# Patient Record
Sex: Female | Born: 1975 | Race: Black or African American | Hispanic: No | Marital: Single | Smoking: Current every day smoker
Health system: Southern US, Community
[De-identification: ages and names within clinical notes are randomized; demographics above are authoritative.]

## PROBLEM LIST (undated history)

## (undated) HISTORY — PX: HAND SURGERY: SHX662

---

## 1998-03-06 ENCOUNTER — Other Ambulatory Visit: Admission: RE | Admit: 1998-03-06 | Discharge: 1998-03-06 | Payer: Self-pay | Admitting: Obstetrics

## 1998-08-02 ENCOUNTER — Other Ambulatory Visit: Admission: RE | Admit: 1998-08-02 | Discharge: 1998-08-02 | Payer: Self-pay | Admitting: Obstetrics

## 1998-11-07 ENCOUNTER — Other Ambulatory Visit: Admission: RE | Admit: 1998-11-07 | Discharge: 1998-11-07 | Payer: Self-pay | Admitting: Obstetrics

## 1999-04-27 ENCOUNTER — Inpatient Hospital Stay (HOSPITAL_COMMUNITY): Admission: AD | Admit: 1999-04-27 | Discharge: 1999-04-27 | Payer: Self-pay | Admitting: Obstetrics

## 1999-07-02 ENCOUNTER — Inpatient Hospital Stay (HOSPITAL_COMMUNITY): Admission: AD | Admit: 1999-07-02 | Discharge: 1999-07-02 | Payer: Self-pay | Admitting: Obstetrics

## 1999-11-18 ENCOUNTER — Other Ambulatory Visit: Admission: RE | Admit: 1999-11-18 | Discharge: 1999-11-18 | Payer: Self-pay | Admitting: Obstetrics

## 1999-12-02 ENCOUNTER — Emergency Department (HOSPITAL_COMMUNITY): Admission: EM | Admit: 1999-12-02 | Discharge: 1999-12-02 | Payer: Self-pay | Admitting: Emergency Medicine

## 1999-12-03 ENCOUNTER — Inpatient Hospital Stay (HOSPITAL_COMMUNITY): Admission: EM | Admit: 1999-12-03 | Discharge: 1999-12-03 | Payer: Self-pay | Admitting: Obstetrics

## 2001-02-02 ENCOUNTER — Emergency Department (HOSPITAL_COMMUNITY): Admission: EM | Admit: 2001-02-02 | Discharge: 2001-02-02 | Payer: Self-pay

## 2001-05-01 ENCOUNTER — Emergency Department (HOSPITAL_COMMUNITY): Admission: EM | Admit: 2001-05-01 | Discharge: 2001-05-01 | Payer: Self-pay | Admitting: Emergency Medicine

## 2001-05-12 ENCOUNTER — Inpatient Hospital Stay (HOSPITAL_COMMUNITY): Admission: AD | Admit: 2001-05-12 | Discharge: 2001-05-12 | Payer: Self-pay | Admitting: Obstetrics

## 2001-12-05 ENCOUNTER — Inpatient Hospital Stay (HOSPITAL_COMMUNITY): Admission: AD | Admit: 2001-12-05 | Discharge: 2001-12-08 | Payer: Self-pay | Admitting: Obstetrics

## 2001-12-05 ENCOUNTER — Encounter (INDEPENDENT_AMBULATORY_CARE_PROVIDER_SITE_OTHER): Payer: Self-pay | Admitting: Specialist

## 2002-01-05 ENCOUNTER — Ambulatory Visit (HOSPITAL_BASED_OUTPATIENT_CLINIC_OR_DEPARTMENT_OTHER): Admission: RE | Admit: 2002-01-05 | Discharge: 2002-01-05 | Payer: Self-pay | Admitting: *Deleted

## 2002-01-05 ENCOUNTER — Encounter (INDEPENDENT_AMBULATORY_CARE_PROVIDER_SITE_OTHER): Payer: Self-pay | Admitting: *Deleted

## 2002-11-08 ENCOUNTER — Emergency Department (HOSPITAL_COMMUNITY): Admission: EM | Admit: 2002-11-08 | Discharge: 2002-11-08 | Payer: Self-pay | Admitting: Emergency Medicine

## 2002-11-08 ENCOUNTER — Encounter: Payer: Self-pay | Admitting: Emergency Medicine

## 2003-11-04 ENCOUNTER — Emergency Department (HOSPITAL_COMMUNITY): Admission: EM | Admit: 2003-11-04 | Discharge: 2003-11-04 | Payer: Self-pay | Admitting: Emergency Medicine

## 2003-12-21 ENCOUNTER — Inpatient Hospital Stay (HOSPITAL_COMMUNITY): Admission: AD | Admit: 2003-12-21 | Discharge: 2003-12-21 | Payer: Self-pay | Admitting: Obstetrics & Gynecology

## 2004-03-11 ENCOUNTER — Inpatient Hospital Stay (HOSPITAL_COMMUNITY): Admission: AD | Admit: 2004-03-11 | Discharge: 2004-03-11 | Payer: Self-pay | Admitting: Obstetrics

## 2004-03-15 ENCOUNTER — Emergency Department (HOSPITAL_COMMUNITY): Admission: EM | Admit: 2004-03-15 | Discharge: 2004-03-15 | Payer: Self-pay | Admitting: Emergency Medicine

## 2004-06-19 ENCOUNTER — Emergency Department (HOSPITAL_COMMUNITY): Admission: EM | Admit: 2004-06-19 | Discharge: 2004-06-19 | Payer: Self-pay | Admitting: Emergency Medicine

## 2004-12-09 ENCOUNTER — Inpatient Hospital Stay (HOSPITAL_COMMUNITY): Admission: AD | Admit: 2004-12-09 | Discharge: 2004-12-09 | Payer: Self-pay | Admitting: *Deleted

## 2005-01-23 ENCOUNTER — Encounter (HOSPITAL_COMMUNITY): Admission: RE | Admit: 2005-01-23 | Discharge: 2005-02-22 | Payer: Self-pay | Admitting: Obstetrics

## 2005-01-24 ENCOUNTER — Ambulatory Visit (HOSPITAL_COMMUNITY): Admission: RE | Admit: 2005-01-24 | Discharge: 2005-01-24 | Payer: Self-pay | Admitting: Obstetrics

## 2005-02-27 ENCOUNTER — Encounter (HOSPITAL_COMMUNITY): Admission: AD | Admit: 2005-02-27 | Discharge: 2005-03-29 | Payer: Self-pay | Admitting: Obstetrics

## 2005-04-21 ENCOUNTER — Inpatient Hospital Stay (HOSPITAL_COMMUNITY): Admission: AD | Admit: 2005-04-21 | Discharge: 2005-04-21 | Payer: Self-pay | Admitting: Obstetrics

## 2005-05-09 ENCOUNTER — Inpatient Hospital Stay (HOSPITAL_COMMUNITY): Admission: AD | Admit: 2005-05-09 | Discharge: 2005-05-09 | Payer: Self-pay | Admitting: Obstetrics

## 2005-05-30 ENCOUNTER — Encounter (INDEPENDENT_AMBULATORY_CARE_PROVIDER_SITE_OTHER): Payer: Self-pay | Admitting: Specialist

## 2005-05-30 ENCOUNTER — Inpatient Hospital Stay (HOSPITAL_COMMUNITY): Admission: AD | Admit: 2005-05-30 | Discharge: 2005-06-02 | Payer: Self-pay | Admitting: Obstetrics

## 2006-01-23 ENCOUNTER — Inpatient Hospital Stay (HOSPITAL_COMMUNITY): Admission: AD | Admit: 2006-01-23 | Discharge: 2006-01-23 | Payer: Self-pay | Admitting: Obstetrics

## 2007-01-20 ENCOUNTER — Inpatient Hospital Stay (HOSPITAL_COMMUNITY): Admission: AD | Admit: 2007-01-20 | Discharge: 2007-01-20 | Payer: Self-pay | Admitting: Obstetrics

## 2007-08-05 ENCOUNTER — Emergency Department (HOSPITAL_COMMUNITY): Admission: EM | Admit: 2007-08-05 | Discharge: 2007-08-05 | Payer: Self-pay | Admitting: Emergency Medicine

## 2007-08-05 ENCOUNTER — Other Ambulatory Visit: Payer: Self-pay | Admitting: Obstetrics

## 2008-09-25 ENCOUNTER — Emergency Department (HOSPITAL_COMMUNITY): Admission: EM | Admit: 2008-09-25 | Discharge: 2008-09-25 | Payer: Self-pay | Admitting: Emergency Medicine

## 2009-03-01 ENCOUNTER — Inpatient Hospital Stay (HOSPITAL_COMMUNITY): Admission: AD | Admit: 2009-03-01 | Discharge: 2009-03-01 | Payer: Self-pay | Admitting: Obstetrics

## 2009-08-05 ENCOUNTER — Emergency Department (HOSPITAL_COMMUNITY): Admission: EM | Admit: 2009-08-05 | Discharge: 2009-08-05 | Payer: Self-pay | Admitting: Emergency Medicine

## 2009-09-19 ENCOUNTER — Emergency Department (HOSPITAL_COMMUNITY): Admission: EM | Admit: 2009-09-19 | Discharge: 2009-09-19 | Payer: Self-pay | Admitting: Emergency Medicine

## 2009-12-24 ENCOUNTER — Ambulatory Visit (HOSPITAL_COMMUNITY): Admission: RE | Admit: 2009-12-24 | Discharge: 2009-12-24 | Payer: Self-pay | Admitting: Obstetrics

## 2009-12-28 ENCOUNTER — Ambulatory Visit (HOSPITAL_COMMUNITY): Admission: RE | Admit: 2009-12-28 | Discharge: 2009-12-28 | Payer: Self-pay | Admitting: Obstetrics

## 2010-02-17 ENCOUNTER — Emergency Department (HOSPITAL_COMMUNITY): Admission: EM | Admit: 2010-02-17 | Discharge: 2010-02-17 | Payer: Self-pay | Admitting: Emergency Medicine

## 2010-02-21 ENCOUNTER — Ambulatory Visit (HOSPITAL_COMMUNITY): Admission: RE | Admit: 2010-02-21 | Discharge: 2010-02-21 | Payer: Self-pay | Admitting: General Surgery

## 2010-06-17 ENCOUNTER — Inpatient Hospital Stay (HOSPITAL_COMMUNITY): Admission: AD | Admit: 2010-06-17 | Discharge: 2010-06-17 | Payer: Self-pay | Admitting: Obstetrics

## 2010-06-17 ENCOUNTER — Ambulatory Visit: Payer: Self-pay | Admitting: Nurse Practitioner

## 2010-11-06 LAB — DIFFERENTIAL
Basophils Absolute: 0 10*3/uL (ref 0.0–0.1)
Basophils Relative: 1 % (ref 0–1)
Eosinophils Absolute: 0 10*3/uL (ref 0.0–0.7)
Eosinophils Relative: 0 % (ref 0–5)
Lymphocytes Relative: 22 % (ref 12–46)
Lymphs Abs: 1.6 10*3/uL (ref 0.7–4.0)
Monocytes Absolute: 0.4 10*3/uL (ref 0.1–1.0)
Monocytes Relative: 5 % (ref 3–12)
Neutro Abs: 5 10*3/uL (ref 1.7–7.7)
Neutrophils Relative %: 72 % (ref 43–77)

## 2010-11-06 LAB — URINALYSIS, ROUTINE W REFLEX MICROSCOPIC
Bilirubin Urine: NEGATIVE
Glucose, UA: NEGATIVE mg/dL
Ketones, ur: NEGATIVE mg/dL
Leukocytes, UA: NEGATIVE
Nitrite: NEGATIVE
Protein, ur: NEGATIVE mg/dL
Specific Gravity, Urine: 1.025 (ref 1.005–1.030)
Urobilinogen, UA: 0.2 mg/dL (ref 0.0–1.0)
pH: 6 (ref 5.0–8.0)

## 2010-11-06 LAB — CBC
HCT: 34.6 % — ABNORMAL LOW (ref 36.0–46.0)
Hemoglobin: 11.9 g/dL — ABNORMAL LOW (ref 12.0–15.0)
MCH: 35.4 pg — ABNORMAL HIGH (ref 26.0–34.0)
MCHC: 34.5 g/dL (ref 30.0–36.0)
MCV: 102.6 fL — ABNORMAL HIGH (ref 78.0–100.0)
Platelets: 195 10*3/uL (ref 150–400)
RBC: 3.37 MIL/uL — ABNORMAL LOW (ref 3.87–5.11)
RDW: 13.3 % (ref 11.5–15.5)
WBC: 7 10*3/uL (ref 4.0–10.5)

## 2010-11-06 LAB — URINE MICROSCOPIC-ADD ON

## 2010-11-06 LAB — WET PREP, GENITAL
Clue Cells Wet Prep HPF POC: NONE SEEN
Trich, Wet Prep: NONE SEEN
Yeast Wet Prep HPF POC: NONE SEEN

## 2010-11-06 LAB — POCT PREGNANCY, URINE: Preg Test, Ur: NEGATIVE

## 2010-11-06 LAB — GC/CHLAMYDIA PROBE AMP, GENITAL
Chlamydia, DNA Probe: NEGATIVE
GC Probe Amp, Genital: NEGATIVE

## 2010-11-10 LAB — DIFFERENTIAL
Basophils Absolute: 0 10*3/uL (ref 0.0–0.1)
Basophils Relative: 0 % (ref 0–1)
Eosinophils Absolute: 0 10*3/uL (ref 0.0–0.7)
Eosinophils Relative: 1 % (ref 0–5)
Lymphocytes Relative: 25 % (ref 12–46)
Lymphs Abs: 2 10*3/uL (ref 0.7–4.0)
Monocytes Absolute: 0.4 10*3/uL (ref 0.1–1.0)
Monocytes Relative: 5 % (ref 3–12)
Neutro Abs: 5.5 10*3/uL (ref 1.7–7.7)
Neutrophils Relative %: 69 % (ref 43–77)

## 2010-11-10 LAB — CBC
HCT: 37.6 % (ref 36.0–46.0)
Hemoglobin: 13 g/dL (ref 12.0–15.0)
MCH: 35.7 pg — ABNORMAL HIGH (ref 26.0–34.0)
MCHC: 34.6 g/dL (ref 30.0–36.0)
MCV: 103.1 fL — ABNORMAL HIGH (ref 78.0–100.0)
Platelets: 205 10*3/uL (ref 150–400)
RBC: 3.65 MIL/uL — ABNORMAL LOW (ref 3.87–5.11)
RDW: 12.6 % (ref 11.5–15.5)
WBC: 7.9 10*3/uL (ref 4.0–10.5)

## 2010-11-10 LAB — HCG, SERUM, QUALITATIVE: Preg, Serum: NEGATIVE

## 2010-11-10 LAB — COMPREHENSIVE METABOLIC PANEL
ALT: 18 U/L (ref 0–35)
AST: 22 U/L (ref 0–37)
Albumin: 4.2 g/dL (ref 3.5–5.2)
Alkaline Phosphatase: 63 U/L (ref 39–117)
BUN: 9 mg/dL (ref 6–23)
CO2: 25 mEq/L (ref 19–32)
Calcium: 9.6 mg/dL (ref 8.4–10.5)
Chloride: 105 mEq/L (ref 96–112)
Creatinine, Ser: 0.68 mg/dL (ref 0.4–1.2)
GFR calc Af Amer: >60 mL/min (ref >60)
GFR calc non Af Amer: >60 mL/min (ref >60)
Glucose, Bld: 89 mg/dL (ref 70–99)
Potassium: 4.1 mEq/L (ref 3.5–5.1)
Sodium: 140 mEq/L (ref 135–145)
Total Bilirubin: 1.2 mg/dL (ref 0.3–1.2)
Total Protein: 7.4 g/dL (ref 6.0–8.3)

## 2010-11-10 LAB — SURGICAL PCR SCREEN
MRSA, PCR: NEGATIVE
Staphylococcus aureus: NEGATIVE

## 2010-11-26 LAB — HEMOCCULT GUIAC POC 1CARD (OFFICE): Fecal Occult Bld: NEGATIVE

## 2010-12-01 LAB — URINALYSIS, ROUTINE W REFLEX MICROSCOPIC
Glucose, UA: 100 mg/dL — AB
Ketones, ur: 15 mg/dL — AB
Nitrite: NEGATIVE
Protein, ur: 100 mg/dL — AB
Specific Gravity, Urine: >1.03 — ABNORMAL HIGH (ref 1.005–1.030)
Urobilinogen, UA: 1 mg/dL (ref 0.0–1.0)
pH: 5.5 (ref 5.0–8.0)

## 2010-12-01 LAB — URINE MICROSCOPIC-ADD ON

## 2010-12-01 LAB — URINE CULTURE

## 2010-12-10 LAB — WET PREP, GENITAL: Trich, Wet Prep: NONE SEEN

## 2010-12-10 LAB — URINE CULTURE: Colony Count: 4000

## 2010-12-10 LAB — URINALYSIS, ROUTINE W REFLEX MICROSCOPIC
Bilirubin Urine: NEGATIVE
Nitrite: NEGATIVE
Specific Gravity, Urine: 1.012 (ref 1.005–1.030)
Urobilinogen, UA: 1 mg/dL (ref 0.0–1.0)
pH: 7 (ref 5.0–8.0)

## 2010-12-10 LAB — GC/CHLAMYDIA PROBE AMP, GENITAL: GC Probe Amp, Genital: NEGATIVE

## 2011-01-10 NOTE — Op Note (Signed)
[place]  Patient:    PIERA, DOWNS Visit Number: [id number] MRN: [id number]          Service Type: DSU Location: [facility] Attending Physician:  Kendell Bane Dictated by:   Lowell Bouton, M.D. Proc. Date: 01/05/02 Admit Date:  01/05/2002                             Operative Report  PREOPERATIVE DIAGNOSIS:  Dorsal ganglion, right wrist.  POSTOPERATIVE DIAGNOSIS:  Dorsal ganglion, right wrist.  OPERATION PERFORMED:  Excision of dorsal ganglion, right wrist.  SURGEON:  Lowell Bouton, M.D.  ANESTHESIA:  General.  OPERATIVE FINDINGS:  The patient had a very large bilobed ganglion that appeared to arise from the scapholunate interval.  DESCRIPTION OF PROCEDURE:  Under general anesthesia with a tourniquet on the right arm, the right hand was prepped and draped in the usual fashion and after exsanguinating the limb, the tourniquet was inflated to 250 mHg.  A transverse incision was made on the dorsum of the wrist overlying the mass. Blunt dissection was carried down to the cyst and bleeding points were coagulated.  The ganglion was dissected out bluntly completely excising it. The stalk was found to arise from the scapholunate interval. This was traced down to the joint and the capsule was debrided.  The wound was then irrigated with saline. Bleeding was controlled with electrocautery.  A vessel loop drain was left in for drainage.  The subcutaneous tissue was closed with 4-0 Vicryl. The skin was closed with 3-0 subcuticular and Prolene.  Steri-Strips were then applied followed by sterile dressings and a volar wrist splint.  The patient tolerated the procedure well and went to the recovery room awake and stable i good condition. Dictated by:   Lowell Bouton, M.D. Attending Physician:  Kendell Bane DD:  01/06/02 TD:  01/06/02 Job: [id number]

## 2011-01-10 NOTE — H&P (Signed)
[facility]  Patient:    Heidi Sharp, Heidi Sharp Visit Number: [id number] MRN: [id number]          Service Type: OBS Location: 910A 9133 01 Attending Physician:  Venita Sheffield Dictated by:   Kathreen Cosier, M.D. Admit Date:  12/05/2001                           History and Physical  HISTORY OF PRESENT ILLNESS:   The patient is a 35 year old gravida 2, para 0-1-0-1.  She had a previous classical C-section at 26 weeks and was scheduled for repeat C-section.  The patients due date was Dec 30, 2001.  She came in laboring all night.  Cervix was 8 cm with a vertex minus 2 to minus 3, membranes intact.  She had a history of Herpes with a recent outbreak three weeks ago.  PHYSICAL EXAMINATION:  GENERAL:                      Revealed a well-developed female in labor.  HEENT:                        Negative.  LUNGS:                        Clear.  HEART:                        Regular rhythm, no murmurs or gallops.  ABDOMEN:                      36 week size.  Fetal heart rate 150.  PELVIC:                       As described above.  EXTREMITIES:                  Negative. Dictated by:   Kathreen Cosier, M.D. Attending Physician:  Venita Sheffield DD:  12/05/01 TD:  12/05/01 Job: [id number]

## 2011-01-10 NOTE — Discharge Summary (Signed)
NAMEMARGRETE, Heidi Sharp NO.:  [id number]   MEDICAL RECORD NO.:  [id number]          PATIENT TYPE:  INP   LOCATION:  9105                          FACILITY:  [facility]   PHYSICIAN:  Kathreen Cosier, M.D.DATE OF BIRTH:  25-Sep-1975   DATE OF ADMISSION:  05/30/2005  DATE OF DISCHARGE:  06/02/2005                                 DISCHARGE SUMMARY   The patient is a 35 year old gravida 3, para 1-1-0-2, [facility] June 16, 2005.  She had a classical cesarean section and a T-incision on her uterus for her  second delivery and she was admitted in labor.  Cervix 3 cm, 100%, vertex, -  2.  She had a history of herpes and was on Valtrex.  The patient was in with  premature labor and underwent a repeat low transverse cesarean section.  She  had a female, Apgar 8 and 8 weighing 5 pounds 9 ounces.  She had a tubal  ligation performed.  Postoperatively her hemoglobin was 9.1.  Urine was  negative.  She did well and was discharged home on the third postoperative  day, ambulatory, on a regular diet, to see me in six weeks.   DISCHARGE DIAGNOSES:  Status post repeat low transverse cesarean section and  tubal ligation.           ______________________________  Kathreen Cosier, M.D.     BAM/MEDQ  D:  06/25/2005  T:  06/25/2005  Job:  [id number]

## 2011-01-10 NOTE — Op Note (Signed)
[facility]  Patient:    Heidi Sharp, Heidi Sharp Visit Number: [id number] MRN: [id number]          Service Type: OBS Location: 910A 9133 01 Attending Physician:  Venita Sheffield Dictated by:   Kathreen Cosier, M.D. Admit Date:  12/05/2001                             Operative Report  PREOPERATIVE DIAGNOSIS:       Previous classical cesarean section at 35+ weeks in labor.  SURGEON:                      Kathreen Cosier, M.D.  FIRST ASSISTANT:              Elinor Dodge, M.D.  ANESTHESIA:                   Spinal.  DESCRIPTION OF PROCEDURE:     The patient was placed on the operating table in the supine position.  The abdomen was prepped and draped and the bladder emptied with a Foley catheter.  A transverse suprapubic incision was made through the old scar and carried down to the rectus fascia.  The fascia was cleanly incised for the length of the incision.  The rectus muscles were retracted laterally.  The peritoneum was incised longitudinally.  A transverse incision was made in the visceral peritoneum above the bladder and the bladder mobilized inferiorly.  A transverse lower uterine incision was made.  Fluid was clear.  The patient was delivered from the OP position of a female with Apgars of 5 and 8 weighing 5 pounds 4 ounces.  The team was in attendance. Cord pH was done.  The placenta was anterior and removed manually.  The uterine cavity was closed and cleaned with a dry lap.  The uterine incision was closed with one layer with continuous suture of #1 chromic.  The bladder flap was reattached with 2-0 chromic.  The uterus was well contracted.  The tubes and ovaries were normal.  The abdomen was closed in layers, the peritoneum with continuous suture of 0 chromic, the fascia with continuous suture of 0 Dexon and the skin closed with skin clips.  Blood loss was 400 cc. Dictated by:   Kathreen Cosier, M.D. Attending Physician:   Venita Sheffield DD:  12/05/01 TD:  12/05/01 Job: [id number]

## 2011-01-10 NOTE — Discharge Summary (Signed)
[facility]  Patient:    Heidi Sharp, Heidi Sharp Visit Number: [id number] MRN: [id number]          Service Type: OBS Location: 910A 9133 01 Attending Physician:  Venita Sheffield Dictated by:   Kathreen Cosier, M.D. Admit Date:  12/05/2001 Discharge Date: 12/08/2001                             Discharge Summary  HISTORY OF PRESENT ILLNESS:   The patient is a 34 year old gravida 2, para 0-1-0-1.  She had a previous C section.  Her EDC was Dec 30, 2001.  She came in in labor, cervix 8 cm, vertex, -2.  She has a history of herpes.  HOSPITAL COURSE:              She underwent repeat low transverse cesarean section, had a 5 pound 4 ounce female, Apgars 5/8.  On admission, her hemoglobin was 11.9, postoperatively 11.4.  She did well and was discharged home on the third postoperative day, tolerating a regular diet.  FOLLOWUP:                     She is to see me in six weeks.  DISCHARGE DIAGNOSIS:          Status post repeat low transverse cesarean section after previous classical cesarean section. Dictated by:   Kathreen Cosier, M.D. Attending Physician:  Venita Sheffield DD:  12/23/01 TD:  12/25/01 Job: [id number]

## 2011-01-10 NOTE — Op Note (Signed)
[name], Sharp NO.:  [id number]   MEDICAL RECORD NO.:  [id number]          PATIENT TYPE:  INP   LOCATION:  9105                          FACILITY:  [facility]   PHYSICIAN:  Kathreen Cosier, M.D.DATE OF BIRTH:  October 02, 1975   DATE OF PROCEDURE:  05/30/2005  DATE OF DISCHARGE:                                 OPERATIVE REPORT   PREOPERATIVE DIAGNOSES:  Previous cesarean section at term, multiparity.   POSTOPERATIVE DIAGNOSES:  Not given.   PROCEDURE:  Repeat low transverse cesarean section, tubal ligation.   SURGEON:  Kathreen Cosier, M.D.   DESCRIPTION OF PROCEDURE:  The patient was placed on the operating table in  supine position, abdomen prepped and draped, bladder emptied with a Foley  catheter. A transverse suprapubic incision made and carried down through an  old scar. A transverse incision made, carried down to the fascia, the fascia  cleaned and incised the length of the incision. The recti muscles retracted  laterally, peritoneum incised longitudinally. A transverse incision made in  the visceroperitoneum above the bladder and bladder mobilized inferiorly. A  transverse lower incision made, patient delivered from the LOA position of a  female, Apgar 8 and 8 weighing 5 pounds 9 ounces. Fluid was clear, the team  was in attendance, placenta was anterior, removed manually and sent the lab.  The uterus closed in one layer with continuous sutures of #1 chromic.  Hemostasis satisfactory. Bladder flap reattached with 2-0 chromic. The  uterus was contracted, the right tube was grasped in mid portion with a  Babcock clamp. A #0 plain suture placed in the mesosalpinx with a portion of  tube in the clamp. This was cut and tied, approximately 1 inch of tube  removed. Hemostasis satisfactory. The procedure done in the a similar  fashion on the other side. Abdomen closed in layers, peritoneum continuous  suture of #0 chromic, fascia continuous suture of #0 Dexon.  The skin closed  with subcuticular stitch of 4-0 Monocryl.           ______________________________  Kathreen Cosier, M.D.     BAM/MEDQ  D:  05/30/2005  T:  05/30/2005  Job:  [id number]

## 2011-06-02 LAB — URINALYSIS, ROUTINE W REFLEX MICROSCOPIC
Bilirubin Urine: NEGATIVE
Ketones, ur: NEGATIVE
Specific Gravity, Urine: 1.015
Urobilinogen, UA: 0.2
pH: 6

## 2011-06-02 LAB — DIFFERENTIAL
Basophils Absolute: 0.1
Eosinophils Absolute: 0.1 — ABNORMAL LOW
Lymphocytes Relative: 35
Neutrophils Relative %: 59

## 2011-06-02 LAB — COMPREHENSIVE METABOLIC PANEL
Alkaline Phosphatase: 48
BUN: 11
Chloride: 105
Creatinine, Ser: 0.63
GFR calc non Af Amer: >60
Glucose, Bld: 93
Potassium: 3.6
Total Bilirubin: 1.1

## 2011-06-02 LAB — CBC
HCT: 34.9 — ABNORMAL LOW
Hemoglobin: 12.2
MCV: 98
WBC: 6.9

## 2011-06-02 LAB — URINE MICROSCOPIC-ADD ON

## 2011-06-02 LAB — LIPASE, BLOOD: Lipase: 23

## 2011-06-02 LAB — POCT PREGNANCY, URINE
Operator id: 134651
Preg Test, Ur: NEGATIVE

## 2011-06-18 ENCOUNTER — Other Ambulatory Visit: Payer: Self-pay | Admitting: Obstetrics

## 2011-09-08 ENCOUNTER — Encounter (HOSPITAL_COMMUNITY): Payer: Self-pay

## 2011-09-08 ENCOUNTER — Emergency Department (INDEPENDENT_AMBULATORY_CARE_PROVIDER_SITE_OTHER)
Admission: EM | Admit: 2011-09-08 | Discharge: 2011-09-08 | Disposition: A | Discharge status: Home / Self Care | Payer: Self-pay | Source: Home / Self Care | Attending: Family Medicine | Admitting: Family Medicine

## 2011-09-08 DIAGNOSIS — M778 Other enthesopathies, not elsewhere classified: Secondary | ICD-10-CM

## 2011-09-08 DIAGNOSIS — M65839 Other synovitis and tenosynovitis, unspecified forearm: Secondary | ICD-10-CM

## 2011-09-08 MED ORDER — MELOXICAM 7.5 MG PO TABS: 7.5000 mg | ORAL_TABLET | Freq: Every day | ORAL | Status: AC

## 2011-09-08 NOTE — ED Notes (Signed)
C/o pain in her left hand since Saturday; states she woke w hand swollen, no known trauma, tried to wrap it up for relief, used ice; c/o feels like a pinched nerve

## 2011-09-08 NOTE — ED Provider Notes (Signed)
History     CSN: [id number]  Arrival date & time 09/08/11  1146   First MD Initiated Contact with Patient 09/08/11 1314      Chief Complaint  Patient presents with  . Hand Pain    (Consider location/radiation/quality/duration/timing/severity/associated sxs/prior treatment) HPI Comments: Since Saturday my L hand has been hurting, it was swollen at first, the swelling has gone now, but still hurting, feels like, a pinch nerve"  Patient is a 36 y.o. female presenting with hand pain.  Hand Pain This is a new problem. The current episode started more than 2 days ago. The problem occurs constantly. The problem has been gradually improving. The symptoms are aggravated by bending and twisting (movement).    History reviewed. No pertinent past medical history.  Past Surgical History  Procedure Date  . Cesarean section   . Hand surgery     History reviewed. No pertinent family history.  History  Substance Use Topics  . Smoking status: Current Everyday Smoker  . Smokeless tobacco: Not on file  . Alcohol Use: Yes    OB History    Grav Para Term Preterm Abortions TAB SAB Ect Mult Living                  Review of Systems  Constitutional: Negative for fever.  Musculoskeletal: Positive for arthralgias.  Neurological: Positive for numbness. Negative for weakness.    Allergies  Review of patient's allergies indicates no known allergies.  Home Medications   Current Outpatient Rx  Name Route Sig Dispense Refill  . MELOXICAM 7.5 MG PO TABS Oral Take 1 tablet (7.5 mg total) by mouth daily. 14 tablet 0    BP 138/84  Pulse 77  Temp(Src) 98.8 F (37.1 C) (Oral)  SpO2 97%  LMP 09/07/2011  Physical Exam  Nursing note and vitals reviewed. Constitutional: No distress.  Musculoskeletal: She exhibits tenderness.       Left wrist: She exhibits decreased range of motion, tenderness and bony tenderness. She exhibits no swelling, no crepitus, no deformity and no laceration.    Neurological: She is alert. She displays no atrophy. No cranial nerve deficit or sensory deficit. She exhibits normal muscle tone.  Skin: No rash noted. No erythema.       ED Course  Procedures (including critical care time)  Labs Reviewed - No data to display No results found.   1. Tendinitis of left wrist       MDM  TENDOSYNOVITIS L wrist (non-trauma) No muscular weakness        Jimmie Molly, MD 09/08/11 2308

## 2012-01-29 ENCOUNTER — Other Ambulatory Visit (HOSPITAL_COMMUNITY): Payer: Self-pay | Admitting: Obstetrics

## 2012-01-29 DIAGNOSIS — Z1231 Encounter for screening mammogram for malignant neoplasm of breast: Secondary | ICD-10-CM

## 2012-02-23 ENCOUNTER — Ambulatory Visit (HOSPITAL_COMMUNITY)
Admission: RE | Admit: 2012-02-23 | Discharge: 2012-02-23 | Disposition: A | Discharge status: Home / Self Care | Payer: Medicaid Other | Source: Ambulatory Visit | Attending: Obstetrics | Admitting: Obstetrics

## 2012-02-23 DIAGNOSIS — Z1231 Encounter for screening mammogram for malignant neoplasm of breast: Secondary | ICD-10-CM | POA: Insufficient documentation

## 2013-02-07 ENCOUNTER — Emergency Department (INDEPENDENT_AMBULATORY_CARE_PROVIDER_SITE_OTHER): Payer: Medicaid Other

## 2013-02-07 ENCOUNTER — Encounter (HOSPITAL_COMMUNITY): Payer: Self-pay

## 2013-02-07 ENCOUNTER — Emergency Department (INDEPENDENT_AMBULATORY_CARE_PROVIDER_SITE_OTHER)
Admission: EM | Admit: 2013-02-07 | Discharge: 2013-02-07 | Disposition: A | Discharge status: Home / Self Care | Payer: Self-pay | Source: Home / Self Care | Attending: Emergency Medicine | Admitting: Emergency Medicine

## 2013-02-07 DIAGNOSIS — S139XXA Sprain of joints and ligaments of unspecified parts of neck, initial encounter: Secondary | ICD-10-CM

## 2013-02-07 DIAGNOSIS — S161XXA Strain of muscle, fascia and tendon at neck level, initial encounter: Secondary | ICD-10-CM

## 2013-02-07 MED ORDER — HYDROCODONE-ACETAMINOPHEN 7.5-325 MG PO TABS: 1.0000 | ORAL_TABLET | Freq: Four times a day (QID) | ORAL | Status: DC | PRN

## 2013-02-07 MED ORDER — KETOROLAC TROMETHAMINE 60 MG/2ML IM SOLN
60.0000 mg | Freq: Once | INTRAMUSCULAR | Status: AC
  Administered 2013-02-07: 60 mg via INTRAMUSCULAR

## 2013-02-07 MED ORDER — KETOROLAC TROMETHAMINE 60 MG/2ML IM SOLN
INTRAMUSCULAR | Status: AC
  Filled 2013-02-07: qty 2
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
## 2013-02-17 NOTE — ED Provider Notes (Signed)
Medical screening examination/treatment/procedure(s) were performed by non-physician practitioner and as supervising physician I was immediately available for consultation/collaboration.  Leslee Home, M.D.   Reuben Likes, MD 02/17/13 [phone]

## 2015-06-23 ENCOUNTER — Encounter (HOSPITAL_COMMUNITY): Payer: Self-pay | Admitting: *Deleted

## 2015-06-23 ENCOUNTER — Emergency Department (HOSPITAL_COMMUNITY)
Admission: EM | Admit: 2015-06-23 | Discharge: 2015-06-23 | Disposition: A | Discharge status: Home / Self Care | Payer: Medicaid - Out of State | Attending: Emergency Medicine | Admitting: Emergency Medicine

## 2015-06-23 ENCOUNTER — Emergency Department (HOSPITAL_COMMUNITY): Payer: Medicaid - Out of State

## 2015-06-23 DIAGNOSIS — Z79899 Other long term (current) drug therapy: Secondary | ICD-10-CM | POA: Diagnosis not present

## 2015-06-23 DIAGNOSIS — Z3202 Encounter for pregnancy test, result negative: Secondary | ICD-10-CM | POA: Insufficient documentation

## 2015-06-23 DIAGNOSIS — Z720 Tobacco use: Secondary | ICD-10-CM | POA: Insufficient documentation

## 2015-06-23 DIAGNOSIS — R1031 Right lower quadrant pain: Secondary | ICD-10-CM | POA: Insufficient documentation

## 2015-06-23 DIAGNOSIS — R1032 Left lower quadrant pain: Secondary | ICD-10-CM | POA: Insufficient documentation

## 2015-06-23 DIAGNOSIS — R1030 Lower abdominal pain, unspecified: Secondary | ICD-10-CM | POA: Diagnosis present

## 2015-06-23 DIAGNOSIS — R109 Unspecified abdominal pain: Secondary | ICD-10-CM

## 2015-06-23 LAB — COMPREHENSIVE METABOLIC PANEL
ALBUMIN: 3.7 g/dL (ref 3.5–5.0)
ALK PHOS: 40 U/L (ref 38–126)
ALT: 15 U/L (ref 14–54)
AST: 20 U/L (ref 15–41)
Anion gap: 7 (ref 5–15)
BILIRUBIN TOTAL: 0.6 mg/dL (ref 0.3–1.2)
BUN: 11 mg/dL (ref 6–20)
CO2: 24 mmol/L (ref 22–32)
CREATININE: 0.66 mg/dL (ref 0.44–1.00)
Calcium: 8.9 mg/dL (ref 8.9–10.3)
Chloride: 103 mmol/L (ref 101–111)
GFR calc Af Amer: >60 mL/min (ref >60)
GLUCOSE: 95 mg/dL (ref 65–99)
POTASSIUM: 3.9 mmol/L (ref 3.5–5.1)
Sodium: 134 mmol/L — ABNORMAL LOW (ref 135–145)
TOTAL PROTEIN: 6.4 g/dL — AB (ref 6.5–8.1)

## 2015-06-23 LAB — URINE MICROSCOPIC-ADD ON

## 2015-06-23 LAB — URINALYSIS, ROUTINE W REFLEX MICROSCOPIC
BILIRUBIN URINE: NEGATIVE
Glucose, UA: NEGATIVE mg/dL
KETONES UR: NEGATIVE mg/dL
Leukocytes, UA: NEGATIVE
Nitrite: NEGATIVE
PH: 7.5 (ref 5.0–8.0)
Protein, ur: NEGATIVE mg/dL
SPECIFIC GRAVITY, URINE: 1.02 (ref 1.005–1.030)
UROBILINOGEN UA: 0.2 mg/dL (ref 0.0–1.0)

## 2015-06-23 LAB — CBC
HEMATOCRIT: 32.8 % — AB (ref 36.0–46.0)
Hemoglobin: 11.1 g/dL — ABNORMAL LOW (ref 12.0–15.0)
MCH: 33.3 pg (ref 26.0–34.0)
MCHC: 33.8 g/dL (ref 30.0–36.0)
MCV: 98.5 fL (ref 78.0–100.0)
PLATELETS: 236 10*3/uL (ref 150–400)
RBC: 3.33 MIL/uL — ABNORMAL LOW (ref 3.87–5.11)
RDW: 12.9 % (ref 11.5–15.5)
WBC: 8.2 10*3/uL (ref 4.0–10.5)

## 2015-06-23 LAB — POC URINE PREG, ED: PREG TEST UR: NEGATIVE

## 2015-06-23 LAB — LIPASE, BLOOD: LIPASE: 33 U/L (ref 11–51)

## 2015-06-23 MED ORDER — ONDANSETRON HCL 4 MG/2ML IJ SOLN
4.0000 mg | Freq: Once | INTRAMUSCULAR | Status: AC
  Administered 2015-06-23: 4 mg via INTRAVENOUS
  Filled 2015-06-23: qty 2

## 2015-06-23 MED ORDER — MORPHINE SULFATE (PF) 4 MG/ML IV SOLN
4.0000 mg | Freq: Once | INTRAVENOUS | Status: AC
  Administered 2015-06-23: 4 mg via INTRAVENOUS
  Filled 2015-06-23: qty 1

## 2015-06-23 MED ORDER — HYDROCODONE-ACETAMINOPHEN 5-325 MG PO TABS: 1.0000 | ORAL_TABLET | Freq: Four times a day (QID) | ORAL | Status: AC | PRN

## 2015-06-23 MED ORDER — SODIUM CHLORIDE 0.9 % IV BOLUS (SEPSIS)
1000.0000 mL | Freq: Once | INTRAVENOUS | Status: AC
  Administered 2015-06-23: 1000 mL via INTRAVENOUS

## 2015-06-23 NOTE — ED Notes (Signed)
Dr. delo at the bedside. 

## 2015-06-23 NOTE — ED Notes (Signed)
Pt. Started having lower abdominal pain last night at 10pm. Pt. States she took an ibuprofen and tried to lay down and it didn't help the pain.

## 2015-06-23 NOTE — ED Notes (Signed)
Pt. Left with all belongings and refused wheelchair. Discharge instructions were reviewed and all questions were answered.  

## 2015-06-23 NOTE — Discharge Instructions (Signed)
Hydrocodone as prescribed as needed for pain.  You have a subtle abnormality in your left ovary which radiology is recommending an outpatient ultrasound. Please follow-up with your primary doctor or gynecologist for this.  Return to the emergency department if you develop worsening pain, high fever, bloody stool, or other new and concerning symptoms.   Pain Without a Known Cause WHAT IS PAIN WITHOUT A KNOWN CAUSE? Pain can occur in any part of the body and can range from mild to severe. Sometimes no cause can be found for why you are having pain. Some types of pain that can occur without a known cause include:   Headache.  Back pain.  Abdominal pain.  Neck pain. HOW IS PAIN WITHOUT A KNOWN CAUSE DIAGNOSED?  Your health care provider will try to find the cause of your pain. This may include:  Physical exam.  Medical history.  Blood tests.  Urine tests.  X-rays. If no cause is found, your health care provider may diagnose you with pain without a known cause.  IS THERE TREATMENT FOR PAIN WITHOUT A CAUSE?  Treatment depends on the kind of pain you have. Your health care provider may prescribe medicines to help relieve your pain.  WHAT CAN I DO AT HOME FOR MY PAIN?   Take medicines only as directed by your health care provider.  Stop any activities that cause pain. During periods of severe pain, bed rest may help.  Try to reduce your stress with activities such as yoga or meditation. Talk to your health care provider for other stress-reducing activity recommendations.  Exercise regularly, if approved by your health care provider.  Eat a healthy diet that includes fruits and vegetables. This may improve pain. Talk to your health care provider if you have any questions about your diet. WHAT IF MY PAIN DOES NOT GET BETTER?  If you have a painful condition and no reason can be found for the pain or the pain gets worse, it is important to follow up with your health care provider.  It may be necessary to repeat tests and look further for a possible cause.    This information is not intended to replace advice given to you by your health care provider. Make sure you discuss any questions you have with your health care provider.   Document Released: 05/06/2001 Document Revised: 09/01/2014 Document Reviewed: 12/27/2013 Else[employer] Interactive Patient Education [employer]2016 [employer]

## 2015-06-23 NOTE — ED Provider Notes (Signed)
CSN: [id number]     Arrival date & time 06/23/15  0304 History  By signing my name below, I, Arianna Nassar, attest that this documentation has been prepared under the direction and in the presence of Geoffery Lyonsouglas Latrina Guttman, MD. Electronically Signed: Octavia HeirArianna Nassar, ED Scribe. 06/23/2015. 3:40 AM.    Chief Complaint  Patient presents with  . Abdominal Pain     The history is provided by the patient. No language interpreter was used.   HPI Comments: Heidi Sharp is a 39 y.o. female who presents to the Emergency Department complaining of constant, gradual worsening lower abdominal pain onset 6 hours ago. She states the pain radiates to her buttocks area. Pt says the pain came suddenly during the course of intercourse. Pt took some ibuprofen to alleviate the pain with no relief. Pt denies vaginal bleeding, vaginal discharge, back pain, fever, cough, cholecystectomy, and appendectomy. Pt states her last menstrual cycle was one month ago.   History reviewed. No pertinent past medical history. Past Surgical History  Procedure Laterality Date  . Cesarean section    . Hand surgery     History reviewed. No pertinent family history. Social History  Substance Use Topics  . Smoking status: Current Every Day Smoker  . Smokeless tobacco: Never Used  . Alcohol Use: Yes   OB History    No data available     Review of Systems  Constitutional: Negative for fever.  Respiratory: Negative for cough.   Genitourinary: Negative for dysuria, vaginal bleeding and vaginal discharge.  All other systems reviewed and are negative.     Allergies  Review of patient's allergies indicates no known allergies.  Home Medications   Prior to Admission medications   Medication Sig Start Date End Date Taking? Authorizing Provider  gabapentin (NEURONTIN) 300 MG capsule Take 300 mg by mouth 3 (three) times daily.    Historical Provider, MD  HYDROcodone-acetaminophen (NORCO) 7.5-325 MG per tablet Take 1 tablet by  mouth every 6 (six) hours as needed for pain. 02/07/13   Naida SleightJames M Owens, PA-C  pravastatin (PRAVACHOL) 10 MG tablet Take 10 mg by mouth daily.    Historical Provider, MD  tiZANidine (ZANAFLEX) 4 MG tablet Take 4 mg by mouth every 6 (six) hours as needed.    Historical Provider, MD  Vitamin D, Ergocalciferol, (DRISDOL) 50000 UNITS CAPS Take 50,000 Units by mouth.    Historical Provider, MD   Triage vitals: BP 143/92 mmHg  Pulse 87  Temp(Src) 98.1 F (36.7 C) (Oral)  Resp 16  Ht 5\' 7"  (1.702 m)  Wt 154 lb (69.854 kg)  BMI 24.11 kg/m2  SpO2 98%  LMP 05/01/2015 Physical Exam  Constitutional: She is oriented to person, place, and time. She appears well-developed and well-nourished. No distress.  HENT:  Head: Normocephalic and atraumatic.  Eyes: EOM are normal.  Neck: Normal range of motion.  Cardiovascular: Normal rate, regular rhythm and normal heart sounds.   Pulmonary/Chest: Effort normal and breath sounds normal.  Abdominal: Soft. She exhibits no distension. There is no tenderness.  TTP across the lower abdomen in the RLQ, LLQ, and suprapubic region.  Musculoskeletal: Normal range of motion.  Neurological: She is alert and oriented to person, place, and time.  Skin: Skin is warm and dry.  Psychiatric: She has a normal mood and affect. Judgment normal.  Nursing note and vitals reviewed.   ED Course  Procedures  DIAGNOSTIC STUDIES: Oxygen Saturation is 98% on RA, normal by my interpretation.  COORDINATION OF CARE:  3:39 AM Discussed treatment plan which includes lab work with pt at bedside and pt agreed to plan.  Labs Review Labs Reviewed - No data to display  Imaging Review No results found. I have personally reviewed and evaluated these images and lab results as part of my medical decision-making.   EKG Interpretation None      MDM   Final diagnoses:  None    Patient presents here with complaints of lower abdominal pain. Her discomfort is suprapubic and  bilateral. Her laboratory studies are reassuring and CT scan reveals no cause for her discomfort. It does show a hyperdense lesion in the left ovary of uncertain significance. I highly doubt this is the cause of her discomfort. She is feeling better after medications in the ER. At this point, I feel as though she is appropriate for discharge. She will be given pain medication and advised to follow-up with her GYN doctor.   I personally performed the services described in this documentation, which was scribed in my presence. The recorded information has been reviewed and is accurate.      Geoffery Lyons, MD 06/23/15 [phone]

## 2017-09-02 IMAGING — CT CT RENAL STONE PROTOCOL
1 of 4 series · 6 of 46 positions shown, 11 images · non-contrast
Comparison: MRI of the pelvis performed 12/28/2009

CLINICAL DATA: Acute onset of lower abdominal pain and pelvic pain,
radiating to the back. Initial encounter.

EXAM:
CT ABDOMEN AND PELVIS WITHOUT CONTRAST
TECHNIQUE: Multidetector CT imaging of the abdomen and pelvis was performed
following the standard protocol without IV contrast.

[Series 206: lungs · axial · 0.81mm/px · z∈[+440,+540]mm · 6 of 30 slices shown, 11 images]
[im 5/30  soft-tissue]
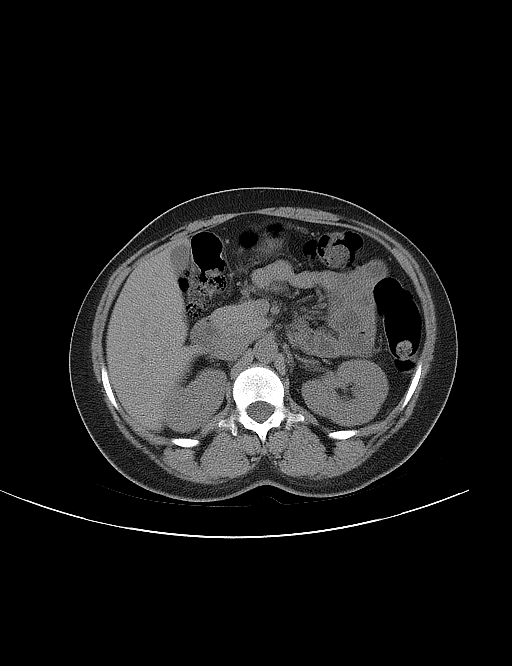
[im 5/30  bone]
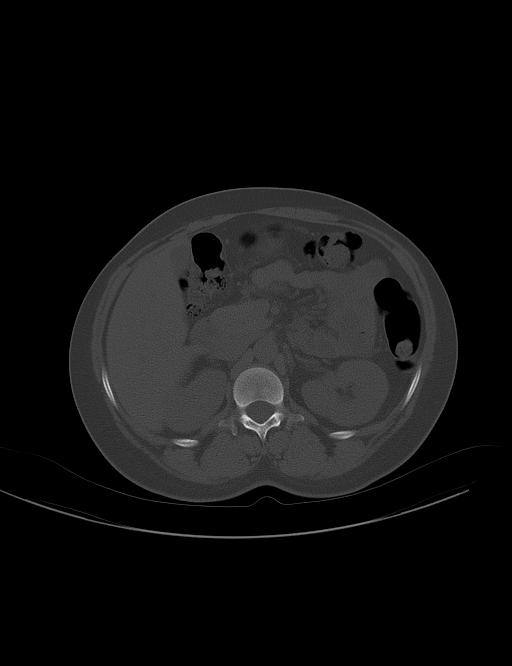
[im 9/30  soft-tissue]
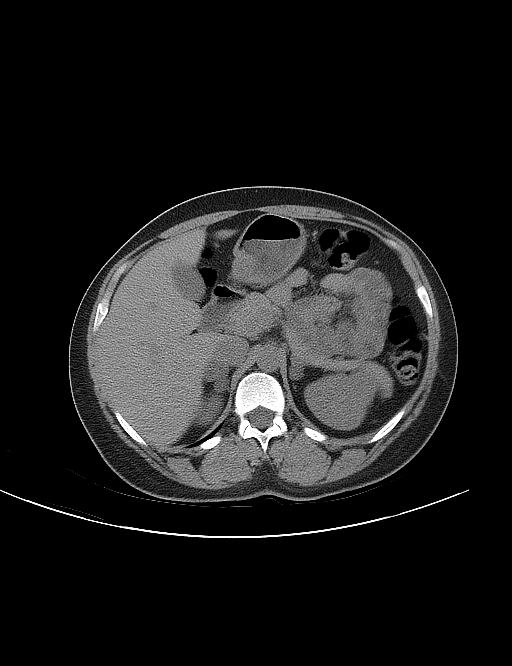
[im 13/30  soft-tissue]
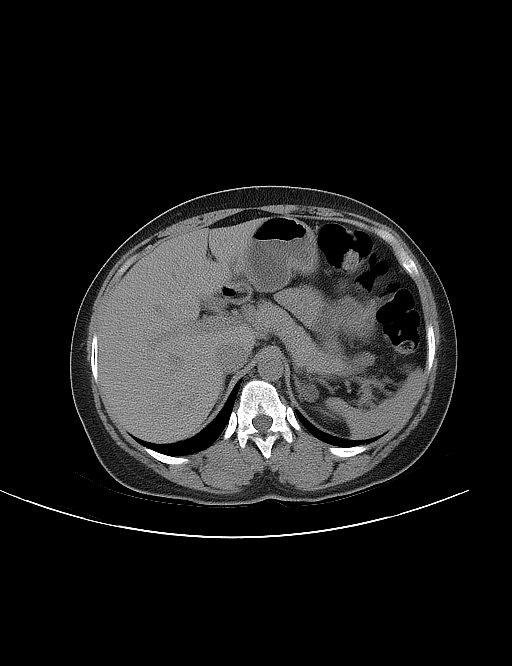
[im 13/30  lung]
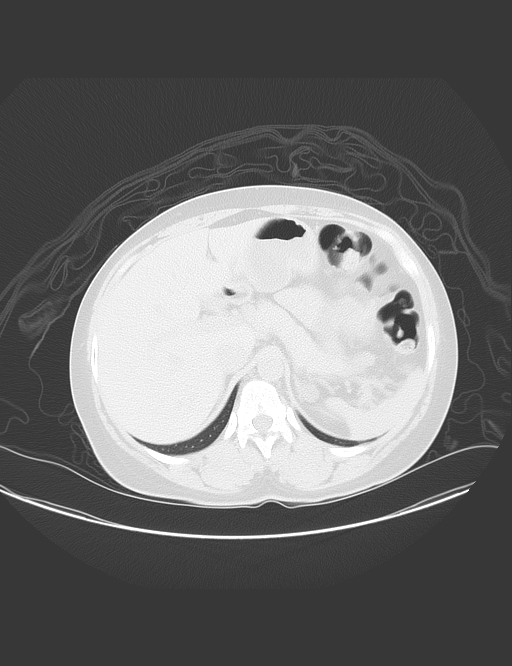
[im 17/30  soft-tissue]
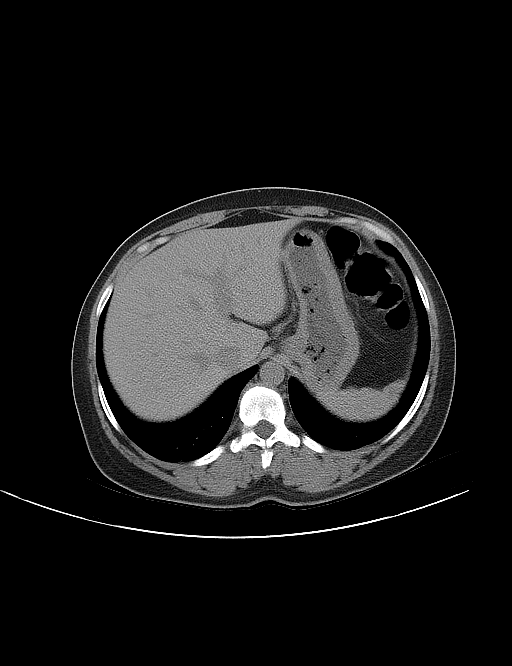
[im 17/30  lung]
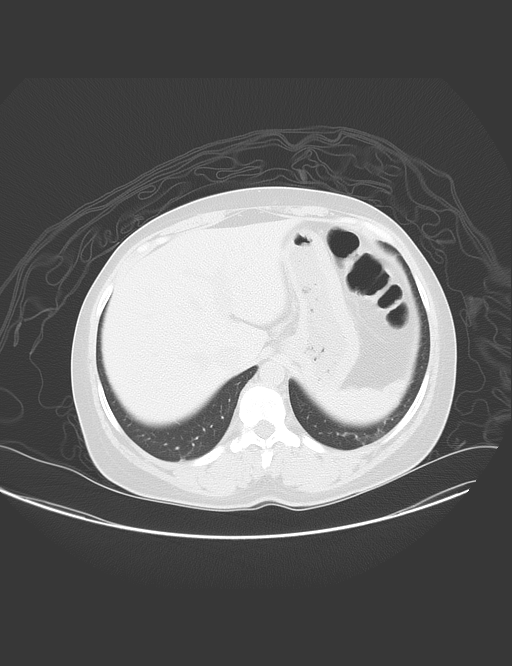
[im 21/30  soft-tissue]
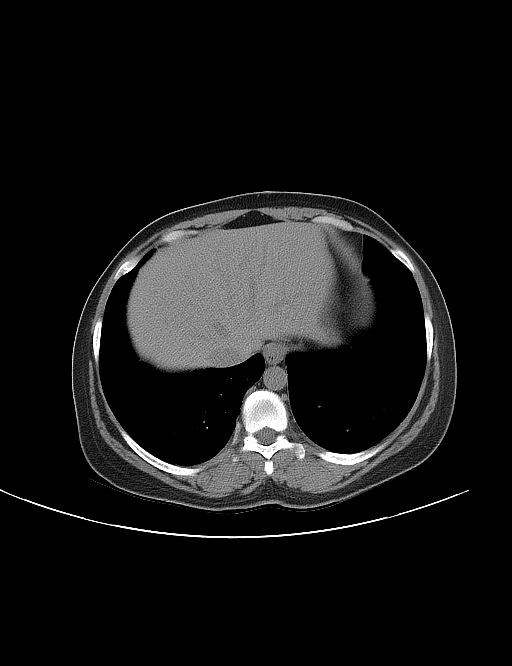
[im 21/30  lung]
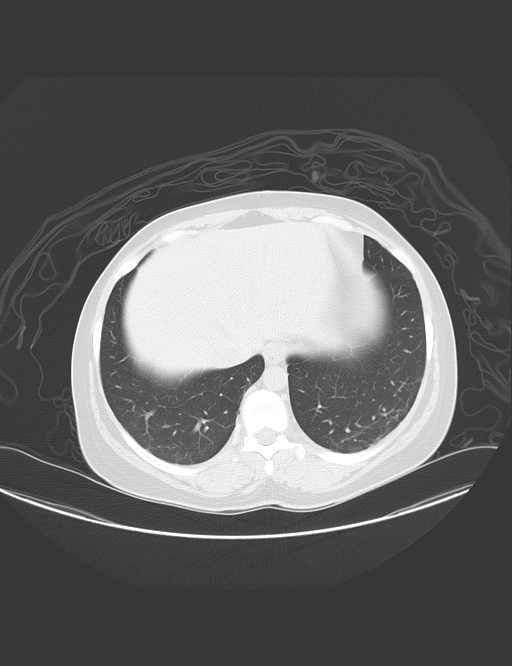
[im 25/30  soft-tissue]
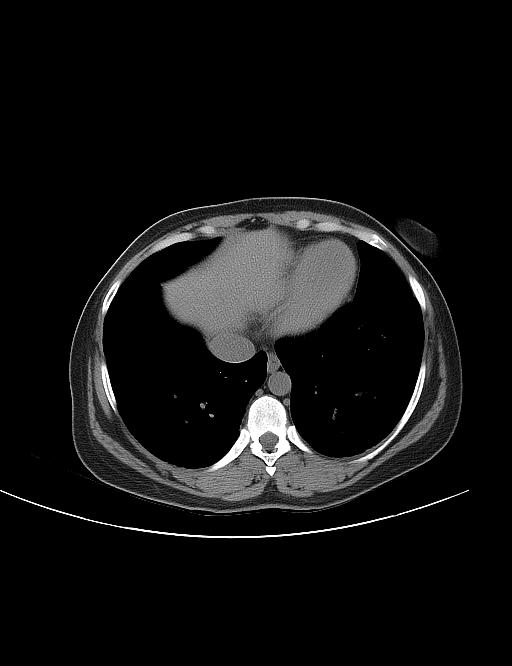
[im 25/30  lung]
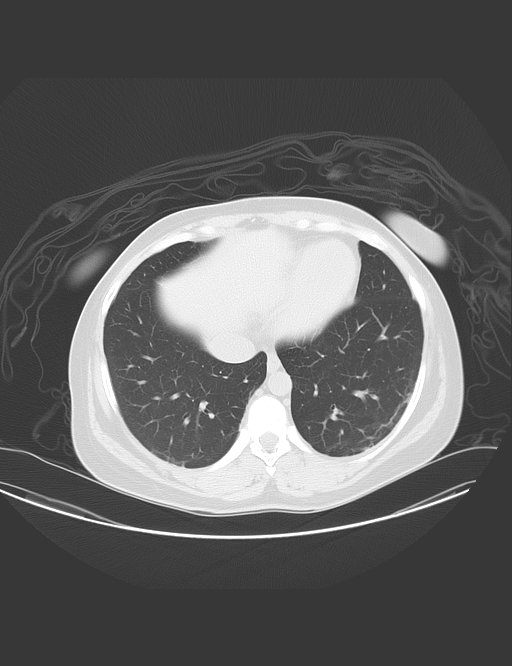

[6 of 46 positions shown; findings below may reference images not displayed]

FINDINGS: The visualized lung bases are clear.

The liver and spleen are unremarkable in appearance. The gallbladder
is within normal limits. Scattered bilateral adrenal nodules
demonstrate attenuation compatible with adrenal adenomas.

The kidneys are unremarkable in appearance. There is no evidence of
hydronephrosis. No renal or ureteral stones are seen. No perinephric
stranding is appreciated.

No free fluid is identified. The small bowel is unremarkable in
appearance. The stomach is within normal limits. No acute vascular
abnormalities are seen.

The appendix is normal in caliber and contains air, without evidence
of appendicitis. The colon is unremarkable in appearance.

The bladder is moderately distended and grossly unremarkable. The
uterus is unremarkable in appearance. A small hyperdense focus is
noted at the left ovary measuring 1.9 cm, of uncertain significance.
No inguinal lymphadenopathy is seen.

No acute osseous abnormalities are identified.
IMPRESSION: 1. 1.9 cm small hyperdense focus at the left ovary, of uncertain
significance. Pelvic ultrasound could be considered for further
evaluation, as deemed clinically appropriate.
2. Scattered bilateral adrenal nodules demonstrate attenuation
compatible with adrenal adenomas.
